# Patient Record
Sex: Female | Born: 1950 | Race: White | Hispanic: No | Marital: Married | Smoking: Former smoker
Health system: Southern US, Community
[De-identification: ages and names within clinical notes are randomized; demographics above are authoritative.]

## PROBLEM LIST (undated history)

## (undated) DIAGNOSIS — K219 Gastro-esophageal reflux disease without esophagitis: Secondary | ICD-10-CM

## (undated) DIAGNOSIS — K589 Irritable bowel syndrome without diarrhea: Secondary | ICD-10-CM

## (undated) DIAGNOSIS — Z87898 Personal history of other specified conditions: Secondary | ICD-10-CM

## (undated) DIAGNOSIS — I471 Supraventricular tachycardia, unspecified: Secondary | ICD-10-CM

## (undated) DIAGNOSIS — Q625 Duplication of ureter: Secondary | ICD-10-CM

## (undated) DIAGNOSIS — M8580 Other specified disorders of bone density and structure, unspecified site: Secondary | ICD-10-CM

## (undated) HISTORY — DX: Supraventricular tachycardia, unspecified: I47.10

## (undated) HISTORY — PX: COLPOSCOPY: SHX161

## (undated) HISTORY — DX: Duplication of ureter: Q62.5

## (undated) HISTORY — DX: Supraventricular tachycardia: I47.1

## (undated) HISTORY — DX: Personal history of other specified conditions: Z87.898

## (undated) HISTORY — DX: Gastro-esophageal reflux disease without esophagitis: K21.9

## (undated) HISTORY — DX: Other specified disorders of bone density and structure, unspecified site: M85.80

## (undated) HISTORY — DX: Irritable bowel syndrome without diarrhea: K58.9

## (undated) HISTORY — PX: CERVICAL BIOPSY  W/ LOOP ELECTRODE EXCISION: SUR135

---

## 1999-07-29 ENCOUNTER — Encounter: Admission: RE | Admit: 1999-07-29 | Discharge: 1999-07-29 | Payer: Self-pay | Admitting: Family Medicine

## 1999-07-29 ENCOUNTER — Encounter: Payer: Self-pay | Admitting: Family Medicine

## 2000-08-03 ENCOUNTER — Other Ambulatory Visit: Admission: RE | Admit: 2000-08-03 | Discharge: 2000-08-03 | Payer: Self-pay | Admitting: Family Medicine

## 2000-10-19 ENCOUNTER — Encounter: Admission: RE | Admit: 2000-10-19 | Discharge: 2000-10-19 | Payer: Self-pay | Admitting: Family Medicine

## 2000-10-19 ENCOUNTER — Encounter (INDEPENDENT_AMBULATORY_CARE_PROVIDER_SITE_OTHER): Payer: Self-pay | Admitting: *Deleted

## 2000-10-19 ENCOUNTER — Encounter: Payer: Self-pay | Admitting: Family Medicine

## 2001-06-27 ENCOUNTER — Emergency Department (HOSPITAL_COMMUNITY): Admission: EM | Admit: 2001-06-27 | Discharge: 2001-06-27 | Payer: Self-pay | Admitting: Emergency Medicine

## 2001-08-08 ENCOUNTER — Other Ambulatory Visit: Admission: RE | Admit: 2001-08-08 | Discharge: 2001-08-08 | Payer: Self-pay | Admitting: Obstetrics and Gynecology

## 2002-03-28 ENCOUNTER — Encounter (INDEPENDENT_AMBULATORY_CARE_PROVIDER_SITE_OTHER): Payer: Self-pay | Admitting: *Deleted

## 2002-03-28 ENCOUNTER — Encounter: Payer: Self-pay | Admitting: Family Medicine

## 2002-03-28 ENCOUNTER — Encounter: Admission: RE | Admit: 2002-03-28 | Discharge: 2002-03-28 | Payer: Self-pay | Admitting: Family Medicine

## 2002-08-14 ENCOUNTER — Other Ambulatory Visit: Admission: RE | Admit: 2002-08-14 | Discharge: 2002-08-14 | Payer: Self-pay | Admitting: Obstetrics and Gynecology

## 2003-08-29 ENCOUNTER — Other Ambulatory Visit: Admission: RE | Admit: 2003-08-29 | Discharge: 2003-08-29 | Payer: Self-pay | Admitting: Obstetrics and Gynecology

## 2004-09-21 ENCOUNTER — Other Ambulatory Visit: Admission: RE | Admit: 2004-09-21 | Discharge: 2004-09-21 | Payer: Self-pay | Admitting: Obstetrics and Gynecology

## 2004-11-30 ENCOUNTER — Ambulatory Visit: Payer: Self-pay | Admitting: Internal Medicine

## 2004-12-18 ENCOUNTER — Ambulatory Visit: Payer: Self-pay | Admitting: Internal Medicine

## 2005-09-24 ENCOUNTER — Other Ambulatory Visit: Admission: RE | Admit: 2005-09-24 | Discharge: 2005-09-24 | Payer: Self-pay | Admitting: Obstetrics and Gynecology

## 2005-09-24 ENCOUNTER — Other Ambulatory Visit: Admission: RE | Admit: 2005-09-24 | Discharge: 2005-09-24 | Payer: Self-pay | Admitting: Obstetrics & Gynecology

## 2006-09-29 ENCOUNTER — Other Ambulatory Visit: Admission: RE | Admit: 2006-09-29 | Discharge: 2006-09-29 | Payer: Self-pay | Admitting: Obstetrics and Gynecology

## 2007-10-10 ENCOUNTER — Other Ambulatory Visit: Admission: RE | Admit: 2007-10-10 | Discharge: 2007-10-10 | Payer: Self-pay | Admitting: Obstetrics and Gynecology

## 2007-10-30 ENCOUNTER — Other Ambulatory Visit: Admission: RE | Admit: 2007-10-30 | Discharge: 2007-10-30 | Payer: Self-pay | Admitting: Obstetrics and Gynecology

## 2008-01-31 ENCOUNTER — Other Ambulatory Visit: Admission: RE | Admit: 2008-01-31 | Discharge: 2008-01-31 | Payer: Self-pay | Admitting: Obstetrics and Gynecology

## 2008-03-28 ENCOUNTER — Encounter (INDEPENDENT_AMBULATORY_CARE_PROVIDER_SITE_OTHER): Payer: Self-pay | Admitting: *Deleted

## 2008-04-18 ENCOUNTER — Encounter (INDEPENDENT_AMBULATORY_CARE_PROVIDER_SITE_OTHER): Payer: Self-pay | Admitting: *Deleted

## 2008-04-19 ENCOUNTER — Encounter: Admission: RE | Admit: 2008-04-19 | Discharge: 2008-04-19 | Payer: Self-pay | Admitting: Family Medicine

## 2008-04-19 ENCOUNTER — Encounter (INDEPENDENT_AMBULATORY_CARE_PROVIDER_SITE_OTHER): Payer: Self-pay | Admitting: *Deleted

## 2008-04-29 ENCOUNTER — Encounter (INDEPENDENT_AMBULATORY_CARE_PROVIDER_SITE_OTHER): Payer: Self-pay | Admitting: *Deleted

## 2008-05-14 ENCOUNTER — Other Ambulatory Visit: Admission: RE | Admit: 2008-05-14 | Discharge: 2008-05-14 | Payer: Self-pay | Admitting: Obstetrics and Gynecology

## 2008-05-20 ENCOUNTER — Encounter (INDEPENDENT_AMBULATORY_CARE_PROVIDER_SITE_OTHER): Payer: Self-pay | Admitting: *Deleted

## 2008-06-17 ENCOUNTER — Encounter (INDEPENDENT_AMBULATORY_CARE_PROVIDER_SITE_OTHER): Payer: Self-pay | Admitting: *Deleted

## 2008-09-23 ENCOUNTER — Encounter (INDEPENDENT_AMBULATORY_CARE_PROVIDER_SITE_OTHER): Payer: Self-pay | Admitting: *Deleted

## 2009-05-13 ENCOUNTER — Ambulatory Visit (HOSPITAL_COMMUNITY): Admission: RE | Admit: 2009-05-13 | Discharge: 2009-05-13 | Payer: Self-pay | Admitting: Obstetrics and Gynecology

## 2009-05-13 HISTORY — PX: ABDOMINAL HYSTERECTOMY: SHX81

## 2009-11-10 ENCOUNTER — Telehealth: Payer: Self-pay | Admitting: Internal Medicine

## 2009-11-10 ENCOUNTER — Encounter (INDEPENDENT_AMBULATORY_CARE_PROVIDER_SITE_OTHER): Payer: Self-pay | Admitting: *Deleted

## 2009-12-17 DIAGNOSIS — M899 Disorder of bone, unspecified: Secondary | ICD-10-CM | POA: Insufficient documentation

## 2009-12-17 DIAGNOSIS — I498 Other specified cardiac arrhythmias: Secondary | ICD-10-CM | POA: Insufficient documentation

## 2009-12-17 DIAGNOSIS — J309 Allergic rhinitis, unspecified: Secondary | ICD-10-CM | POA: Insufficient documentation

## 2009-12-17 DIAGNOSIS — K648 Other hemorrhoids: Secondary | ICD-10-CM | POA: Insufficient documentation

## 2009-12-17 DIAGNOSIS — K589 Irritable bowel syndrome without diarrhea: Secondary | ICD-10-CM

## 2009-12-17 DIAGNOSIS — L439 Lichen planus, unspecified: Secondary | ICD-10-CM | POA: Insufficient documentation

## 2009-12-17 DIAGNOSIS — F411 Generalized anxiety disorder: Secondary | ICD-10-CM | POA: Insufficient documentation

## 2009-12-17 DIAGNOSIS — M949 Disorder of cartilage, unspecified: Secondary | ICD-10-CM

## 2009-12-17 DIAGNOSIS — K219 Gastro-esophageal reflux disease without esophagitis: Secondary | ICD-10-CM | POA: Insufficient documentation

## 2009-12-17 DIAGNOSIS — L80 Vitiligo: Secondary | ICD-10-CM | POA: Insufficient documentation

## 2009-12-22 ENCOUNTER — Ambulatory Visit: Payer: Self-pay | Admitting: Internal Medicine

## 2009-12-22 DIAGNOSIS — R1011 Right upper quadrant pain: Secondary | ICD-10-CM

## 2009-12-23 LAB — CONVERTED CEMR LAB
Bilirubin Urine: NEGATIVE
Hemoglobin, Urine: NEGATIVE
Ketones, ur: NEGATIVE mg/dL
Nitrite: NEGATIVE
Specific Gravity, Urine: 1.01 (ref 1.000–1.030)
Total Protein, Urine: NEGATIVE mg/dL
Urine Glucose: NEGATIVE mg/dL
Urobilinogen, UA: 0.2 (ref 0.0–1.0)
pH: 8 (ref 5.0–8.0)

## 2009-12-25 ENCOUNTER — Ambulatory Visit
Admission: RE | Admit: 2009-12-25 | Discharge: 2009-12-25 | Payer: Self-pay | Source: Home / Self Care | Admitting: Gynecologic Oncology

## 2010-01-21 ENCOUNTER — Telehealth: Payer: Self-pay | Admitting: Internal Medicine

## 2010-03-17 NOTE — Letter (Signed)
Summary: Dr Evette Cristal Office Note  Dr Evette Cristal Office Note   Imported By: Lamona Curl CMA (AAMA) 12/17/2009 17:24:42  _____________________________________________________________________  External Attachment:    Type:   Image     Comment:   External Document

## 2010-03-17 NOTE — Letter (Signed)
Summary: Dr Evette Cristal Office Note  Dr Evette Cristal Office Note   Imported By: Lamona Curl CMA (AAMA) 12/17/2009 17:25:13  _____________________________________________________________________  External Attachment:    Type:   Image     Comment:   External Document

## 2010-03-17 NOTE — Progress Notes (Signed)
Summary: triage  Phone Note From Other Clinic Call back at [phone] (pt's home)   Caller: Patient Caller: records faxed Call For: Dr. Juanda Chance Reason for Call: Schedule Patient Appt Summary of Call: pt referred by [facility] and ok'ed by Dr. Juanda Chance... ok to sch... nothing available... faxed records given to [facility] Initial call taken by: Vallarie Mare,  November 10, 2009 4:29 PM  Follow-up for Phone Call        NP3 12/22/09 8:45 Follow-up by: Darcey Nora RN, CGRN,  November 10, 2009 4:43 PM  Additional Follow-up for Phone Call Additional follow up Details #1::        pt notifiedof 12/22/2009 appt at 8:45... pt ok with this date and time... np ppwk will be mailed to verified address... records given to Hortense Ramal for upcoming appt Additional Follow-up by: Vallarie Mare,  November 10, 2009 4:49 PM

## 2010-03-17 NOTE — Letter (Signed)
Summary: Dr Evette Cristal Office Note  Dr Evette Cristal Office Note   Imported By: Lamona Curl CMA (AAMA) 12/17/2009 17:19:23  _____________________________________________________________________  External Attachment:    Type:   Image     Comment:   External Document

## 2010-03-17 NOTE — Progress Notes (Signed)
Summary: update  Phone Note Call from Patient Call back at Home Phone [phone]   Caller: Patient Call For: Dr. Juanda Chance Reason for Call: Talk to Nurse Summary of Call: finshed Chlordiazepoxide-Clidiniu and pt says it helped but has not "solved the problem" Initial call taken by: Vallarie Mare,  January 21, 2010 3:38 PM  Follow-up for Phone Call        Patient calling with her update after taking the Librax for one month. Patient states the Librax helped her left abdominal pain on some days and some days it did not help. Overall, she feels her pain was decreased. Patient states that in the past, she has taken Xanax prn for the pain and this helped much better than the Librax . Please, advise. Follow-up by: Jesse Fall RN,  January 21, 2010 3:56 PM  Additional Follow-up for Phone Call Additional follow up Details #1::        Ok, try Xanax .25 mg, # 90 1 by mouth three times a day as needed abd pain, 1 refill. Please tell her that Xanax could be habit forming . Additional Follow-up by: Hart Carwin MD,  January 21, 2010 11:06 PM    Additional Follow-up for Phone Call Additional follow up Details #2::    Patient notified of Dr. Regino Schultze recommendations. Patient is aware Xanax can be habit forming. Rx faxed to patients pharmacy. Follow-up by: Jesse Fall RN,  January 22, 2010 10:35 AM  New/Updated Medications: ALPRAZOLAM 0.25 MG TABS (ALPRAZOLAM) Take one tab three times a day as needed abdominal pain Prescriptions: ALPRAZOLAM 0.25 MG TABS (ALPRAZOLAM) Take one tab three times a day as needed abdominal pain  #90 x 1   Entered by:   Jesse Fall RN   Authorized by:   Hart Carwin MD   Signed by:   Jesse Fall RN on 01/22/2010   Method used:   Printed then faxed to ...       [facility]* (retail)       [address]       [place], [place]  [id number]       Ph: [phone]       Fax: [phone]   RxID:   [id number]

## 2010-03-17 NOTE — Letter (Signed)
Summary: Office Visit-Dr [employer] Visit-Dr Evette Cristal   Imported By: Lamona Curl CMA (AAMA) 12/17/2009 17:33:26  _____________________________________________________________________  External Attachment:    Type:   Image     Comment:   External Document

## 2010-03-17 NOTE — Letter (Signed)
Summary: New Patient letter  [facility]  [address] [place], [place] [address]   Phone: [phone]  Fax: [phone]       11/10/2009 MRN: [id number]  Marissa Douglas [address] [place], [place]  [address]  Dear Marissa Douglas,  Welcome to the Gastroenterology Division at [facility].    You are scheduled to see Dr. Juanda Chance on 12/22/2009 at 8:45PM on the 3rd floor at [facility], [address]  We ask that you try to arrive at our office 15 minutes prior to your appointment time to allow for check-in.  We would like you to complete the enclosed self-administered evaluation form prior to your visit and bring it with you on the day of your appointment.  We will review it with you.  Also, please bring a complete list of all your medications or, if you prefer, bring the medication bottles and we will list them.  Please bring your insurance card so that we may make a copy of it.  If your insurance requires a referral to see a specialist, please bring your referral form from your primary care physician.  Co-payments are due at the time of your visit and may be paid by cash, check or credit card.     Your office visit will consist of a consult with your physician (includes a physical exam), any laboratory testing he/she may order, scheduling of any necessary diagnostic testing (e.g. x-ray, ultrasound, CT-scan), and scheduling of a procedure (e.g. Endoscopy, Colonoscopy) if required.  Please allow enough time on your schedule to allow for any/all of these possibilities.    If you cannot keep your appointment, please call [phone] to cancel or reschedule prior to your appointment date.  This allows [place] the opportunity to schedule an appointment for another patient in need of care.  If you do not cancel or reschedule by 5 p.m. the business day prior to your appointment date, you will be charged a $50.00 late cancellation/no-show fee.    Thank you for choosing  [facility] for your medical needs.  We appreciate the opportunity to care for you.  Please visit [place] at our website  to learn more about our practice.                     Sincerely,                                                             The Gastroenterology Division

## 2010-03-17 NOTE — Letter (Signed)
Summary: [facility]  [facility] Office Note   Imported By: Lamona Curl CMA (AAMA) 12/17/2009 17:18:10  _____________________________________________________________________  External Attachment:    Type:   Image     Comment:   External Document

## 2010-03-17 NOTE — Letter (Signed)
Summary: [facility]  [facility] Office Note   Imported By: Lamona Curl CMA (AAMA) 12/17/2009 17:26:26  _____________________________________________________________________  External Attachment:    Type:   Image     Comment:   External Document

## 2010-03-17 NOTE — Assessment & Plan Note (Signed)
Summary: IBS/Marissa Douglas   History of Present Illness Visit Type: new patient  Primary GI MD: Lina Sar MD Primary Provider: Clayborn Heron, MD  Requesting Provider: na  Chief Complaint: Pt c/o IBS, GERD, and diarrhea History of Present Illness:   This is a 60 year old white female with lower abdominal pain accross  suprapubic area and  lower abdomen anteriorly without radiation to the rectum or to the back. The pain started gradually about 2 years ago after she got upset. Since then, it has not been associated with stress at all. There has been some improvement after  a bowel movement. The pain is relieved completely when laying down after about 10 minutes. It never wakes her up at night. It starts about an hour after she gets up in the morning. She has frequent urination every 2 hours. Patient underwent a robotic TAH and BSO in March of this year for abnormal cytology. Following surgery, the pain disappeared for about 3 weeks and then came back. She has at least half days without pain. It is not associated with eating and it has not been progressive. She has been able to go on with her daily activities. She works out 3 times a week and walks at least 2 miles a day. Her weight has been stable. She was tried on Levsin, Paxil and Xanax. Xanax 0.25 mg relieves the pain within an hour. Her last colonoscopy in 2006 was normal except for internal hemorrhoids. A CT scan of the abdomen in 2010 was essentially normal except for calcifications in the aorta. An upper abdominal ultrasound in 2002 was normal. An upper GI series in 2004 was also normal.   GI Review of Systems    Reports acid reflux and  heartburn.      Denies abdominal pain, belching, bloating, chest pain, dysphagia with liquids, dysphagia with solids, loss of appetite, nausea, vomiting, vomiting blood, weight loss, and  weight gain.      Reports diarrhea and  irritable bowel syndrome.     Denies anal fissure, black tarry stools, change in  bowel habit, constipation, diverticulosis, fecal incontinence, heme positive stool, hemorrhoids, jaundice, light color stool, liver problems, rectal bleeding, and  rectal pain.    Current Medications (verified): 1)  Alprazolam 0.25 Mg Tabs (Alprazolam) .... Take 1 Tablet By Mouth Three Times A Day As Needed 2)  Calcium 600-D 600-400 Mg-Unit Tabs (Calcium Carbonate-Vitamin D) .... Take 1 Tablet By Mouth Two Times A Day 3)  Omega-3 1000 Mg Caps (Omega-3 Fatty Acids) .... Take 1 Capsule By Mouth Two Times A Day 4)  Multivitamins  Tabs (Multiple Vitamin) .... Take 1 Tablet By Mouth Once A Day 5)  Benadryl 25 Mg Tabs (Diphenhydramine Hcl) .... Take 1 Capsule By Mouth 3-4 Times Daily As Needed For Seasonal Allergies 6)  Prilosec Otc 20 Mg Tbec (Omeprazole Magnesium) .... As Needed 7)  Metoprolol Tartrate 50 Mg Tabs (Metoprolol Tartrate) .... Take 1/2 Tablet At [facility] of Tachycardia and Repeat in 1-2 Hours 8)  Plaquenil 200 Mg Tabs (Hydroxychloroquine Sulfate) .... Takes 400mg  Once Daily By Mouth 9)  Folic Acid 800 Mcg Tabs (Folic Acid) .... One Tablet By Mouth Once Daily  Allergies (verified): 1)  ! Fosamax  Past History:  Past Medical History: INTERNAL HEMORRHOIDS (ICD-455.0) ANXIETY (ICD-300.00) IRRITABLE BOWEL SYNDROME (ICD-564.1) ALLERGIC RHINITIS (ICD-477.9) OSTEOPENIA (ICD-733.90) VITILIGO (ICD-709.01) LICHEN PLANUS (ICD-697.0) SUPRAVENTRICULAR TACHYCARDIA (ICD-427.89) GERD (ICD-530.81)    Past Surgical History: Reviewed history from 12/17/2009 and no changes required. Foot Surgery Total Hysterectomy  Family History: Reviewed history from 12/17/2009 and no changes required. Family History of Heart Disease: PGF,PGM,MGF No FH of Colon Cancer:  Social History: Homemaker Married No Childern Patient currently smokes.  Alcohol Use - yes Daily Caffeine Use: coffee and tea  Illicit Drug Use - no Drug Use:  no  Review of Systems       The patient complains of allergy/sinus,  anxiety-new, and skin rash.  The patient denies anemia, arthritis/joint pain, back pain, blood in urine, breast changes/lumps, change in vision, confusion, cough, coughing up blood, depression-new, fainting, fatigue, fever, headaches-new, hearing problems, heart murmur, heart rhythm changes, itching, menstrual pain, muscle pains/cramps, night sweats, nosebleeds, pregnancy symptoms, shortness of breath, sleeping problems, sore throat, swelling of feet/legs, swollen lymph glands, thirst - excessive , urination - excessive , urination changes/pain, urine leakage, vision changes, and voice change.         Pertinent positive and negative review of systems were noted in the above HPI. All other ROS was otherwise negative.   Vital Signs:  Patient profile:   60 year old female Height:      65 inches Weight:      132 pounds BMI:     22.05 BSA:     1.66 Pulse rate:   76 / minute Pulse rhythm:   regular BP sitting:   128 / 80  (left arm) Cuff size:   regular  Vitals Entered By: Ok Anis CMA (December 22, 2009 8:19 AM)  Physical Exam  General:  Well developed, well nourished, no acute distress. Mouth:  No deformity or lesions, dentition normal. Lungs:  Clear throughout to auscultation. Heart:  Regular rate and rhythm; no murmurs, rubs,  or bruits. Abdomen:  Soft, nontender and nondistended. No masses, hepatosplenomegaly or hernias noted. Normal bowel sounds with minimal tenderness in the right lower quadrant. When standing there is no hernia. There is mild discomfort when pressing over the urinary bladder. Rectal:  normal perianal area. Normal rectal tone. Stool is soft Hemoccult negative. No impaction.   Impression & Recommendations:  Problem # 1:  RUQ PAIN (ICD-789.01) Patient hasa  pain in the suprapubic area and across the lower abdomen. It resembles pressure due to  pelvic relaxation. The pain is relieved by lying down and is worse by standing up. There is no evidence of hernia on physical  exam or on CT scan. Pain is relieved by antispasmodics . Irritable bowel syndrome is another possibility. Symptomatic diverticulosis is unlikely since no diverticuli were reported on her last colonoscopy. There is no evidence of acute GI blood loss. We will try her on Librax one 2-3 times a day and if not successful, I would refer her to a urologist to rule out interstitial cystitis. I would also consider a repeat colonoscopy if Librax does not help. Orders: TLB-Udip w/ Micro (81001-[id number])  Patient Instructions: 1)  Please pick up your prescriptions at the pharmacy. Electronic prescription(s) has already been sent for Librax. 2)  Your physician requests that you go to the basement floor of our office to have the following labwork completed before leaving today: Urinalysis 3)  Copy sent to : Dr V.Rankins 4)  The medication list was reviewed and reconciled.  All changed / newly prescribed medications were explained.  A complete medication list was provided to the patient / caregiver. Prescriptions: LIBRAX 2.5-5 MG CAPS (CLIDINIUM-CHLORDIAZEPOXIDE) Take 1 capsule by mouth two times a day to three times a day as needed  #90 x 0   Entered by:  Dottie Nelson-Smith CMA (AAMA)   Authorized by:   Hart Carwin MD   Signed by:   Lamona Curl CMA (AAMA) on 12/22/2009   Method used:   Electronically to        [facility]* (retail)       [address]       [place], [place]  [id number]       Ph: [phone]       Fax: [phone]   RxID:   [id number]

## 2010-03-17 NOTE — Procedures (Signed)
Summary: COLON   Colonoscopy  Procedure date:  12/18/2004  Findings:      Location:  [facility].    Procedures Next Due Date:    Colonoscopy: 12/2014 Patient Name: Marissa, Douglas MRN:  Procedure Procedures: Colonoscopy CPT: 14782.  Personnel: Endoscopist: Dora L. Juanda Chance, MD.  Referred By: Mosetta Putt, M.D.  Exam Location: Exam performed in Outpatient Clinic. Outpatient  Patient Consent: Procedure, Alternatives, Risks and Benefits discussed, consent obtained, from patient. Consent was obtained by the RN.  Indications  Average Risk Screening Routine.  History  Current Medications: Patient is not currently taking Coumadin.  Pre-Exam Physical: Performed Dec 18, 2004. Entire physical exam was normal.  Exam Exam: Extent of exam reached: Cecum, extent intended: Cecum.  The cecum was identified by appendiceal orifice and IC valve. Colon retroflexion performed. Images taken. ASA Classification: I. Tolerance: good.  Monitoring: Pulse and BP monitoring, Oximetry used. Supplemental O2 given.  Colon Prep Used Miralax for colon prep. Prep results: good.  Sedation Meds: Patient assessed and found to be appropriate for moderate (conscious) sedation. Fentanyl 100 mcg. given IV. Versed 10 mg. given IV.  Findings - NORMAL EXAM: Cecum.  - HEMORRHOIDS: Internal. Size: Grade I. ICD9: Hemorrhoids, Internal: 455.0.   Assessment Normal examination.  Diagnoses: 455.0: Hemorrhoids, Internal.   Comments: no polyps Events  Unplanned Interventions: No intervention was required.  Unplanned Events: There were no complications. Plans Patient Education: Patient given standard instructions for: Yearly hemoccult testing recommended. Patient instructed to get routine colonoscopy every 10 years.  Disposition: After procedure patient sent to recovery. After recovery patient sent home.   This report was created from the original endoscopy report, which was  reviewed and signed by the above listed endoscopist.

## 2010-05-10 LAB — CBC
HCT: 41.5 % (ref 36.0–46.0)
Platelets: 296 10*3/uL (ref 150–400)
RDW: 12.4 % (ref 11.5–15.5)
WBC: 4.5 10*3/uL (ref 4.0–10.5)

## 2010-05-10 LAB — COMPREHENSIVE METABOLIC PANEL
ALT: 14 U/L (ref 0–35)
AST: 24 U/L (ref 0–37)
Albumin: 4.9 g/dL (ref 3.5–5.2)
Alkaline Phosphatase: 63 U/L (ref 39–117)
BUN: 8 mg/dL (ref 6–23)
Chloride: 97 mEq/L (ref 96–112)
GFR calc Af Amer: >60 mL/min (ref >60)
Potassium: 3.8 mEq/L (ref 3.5–5.1)
Sodium: 134 mEq/L — ABNORMAL LOW (ref 135–145)
Total Bilirubin: 1.2 mg/dL (ref 0.3–1.2)
Total Protein: 7.1 g/dL (ref 6.0–8.3)

## 2011-04-05 IMAGING — CR DG CHEST 2V
2 series · 2 of 2 positions shown · non-contrast
Comparison: None

CLINICAL DATA: Preop radiograph

CHEST - 2 VIEW

[w chest pa]
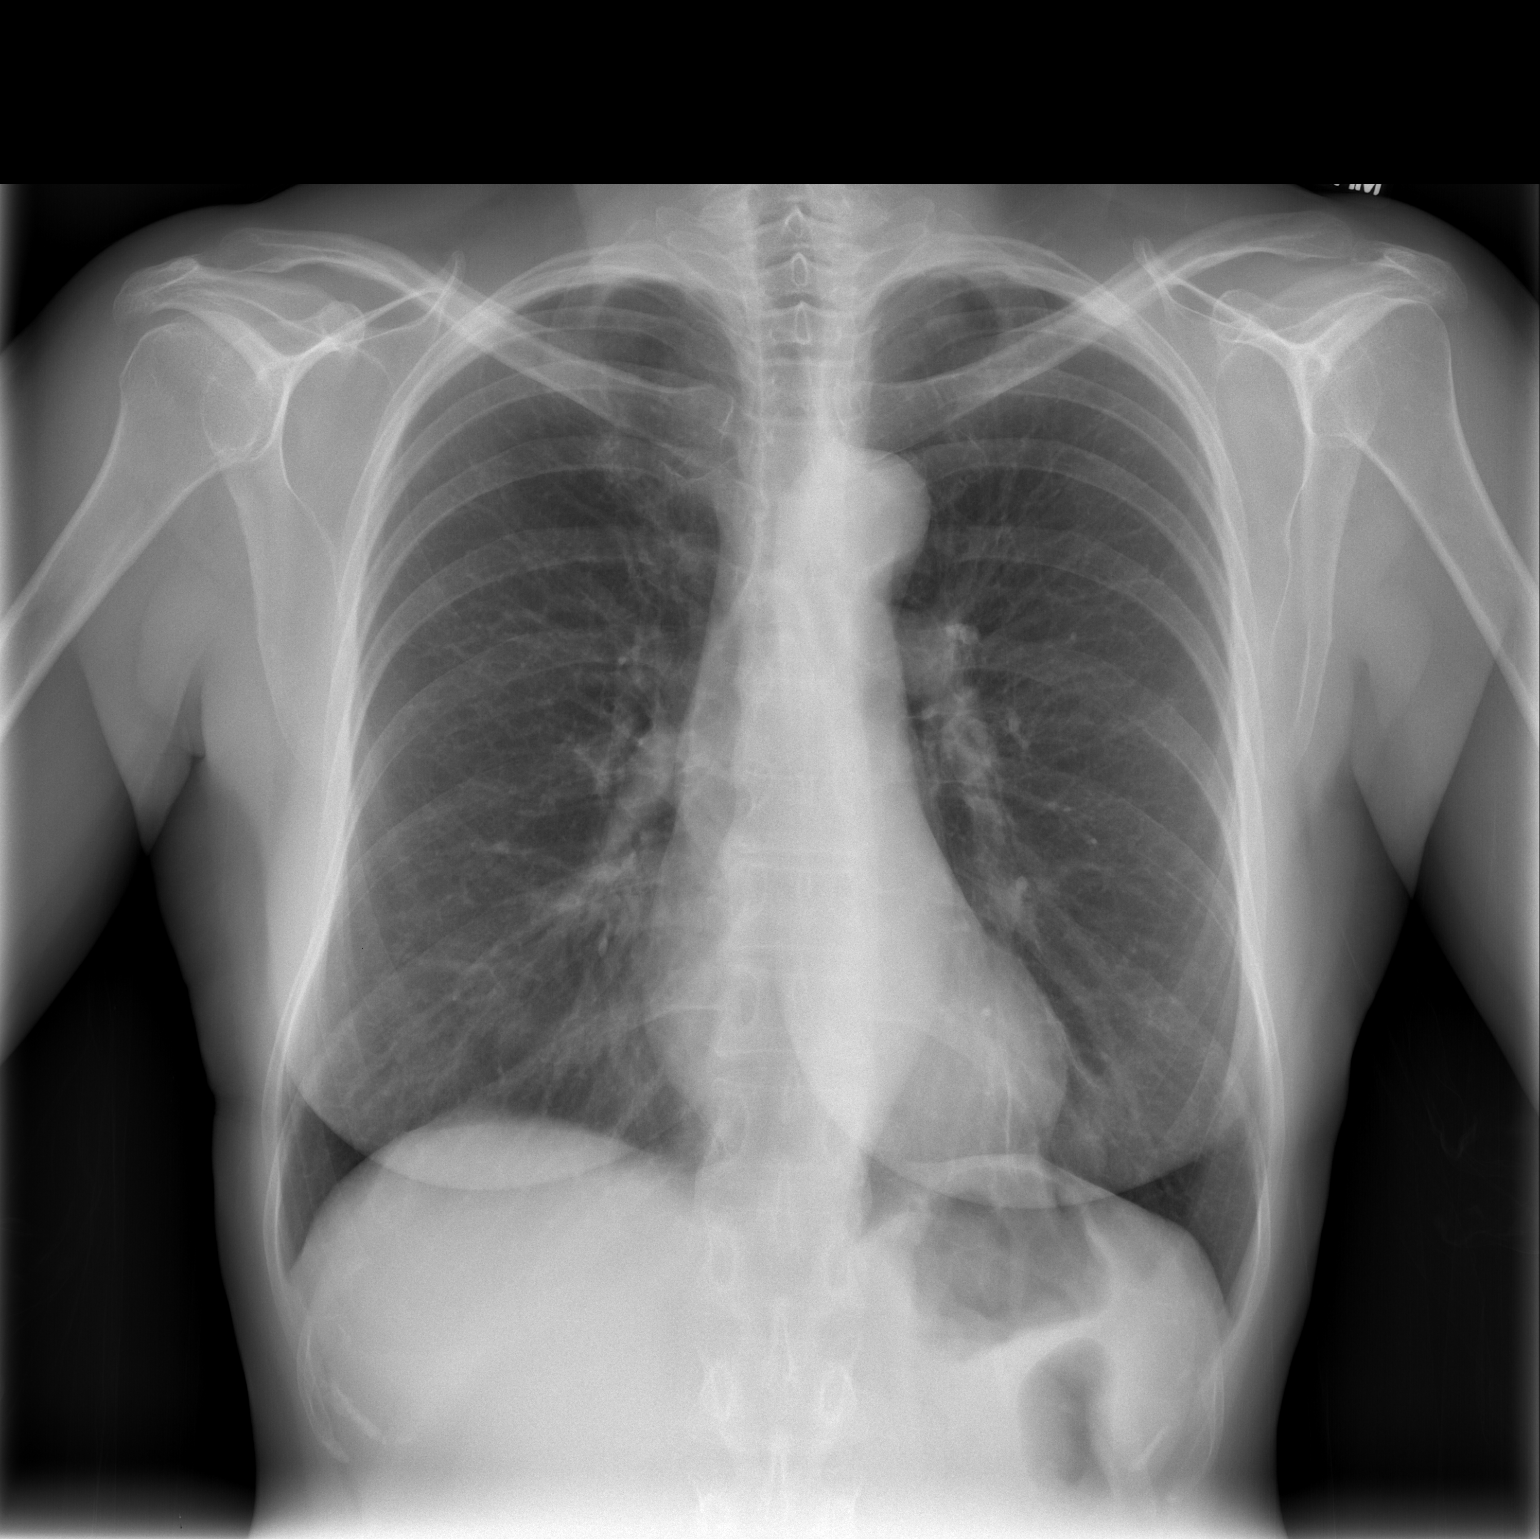

[w chest lat]
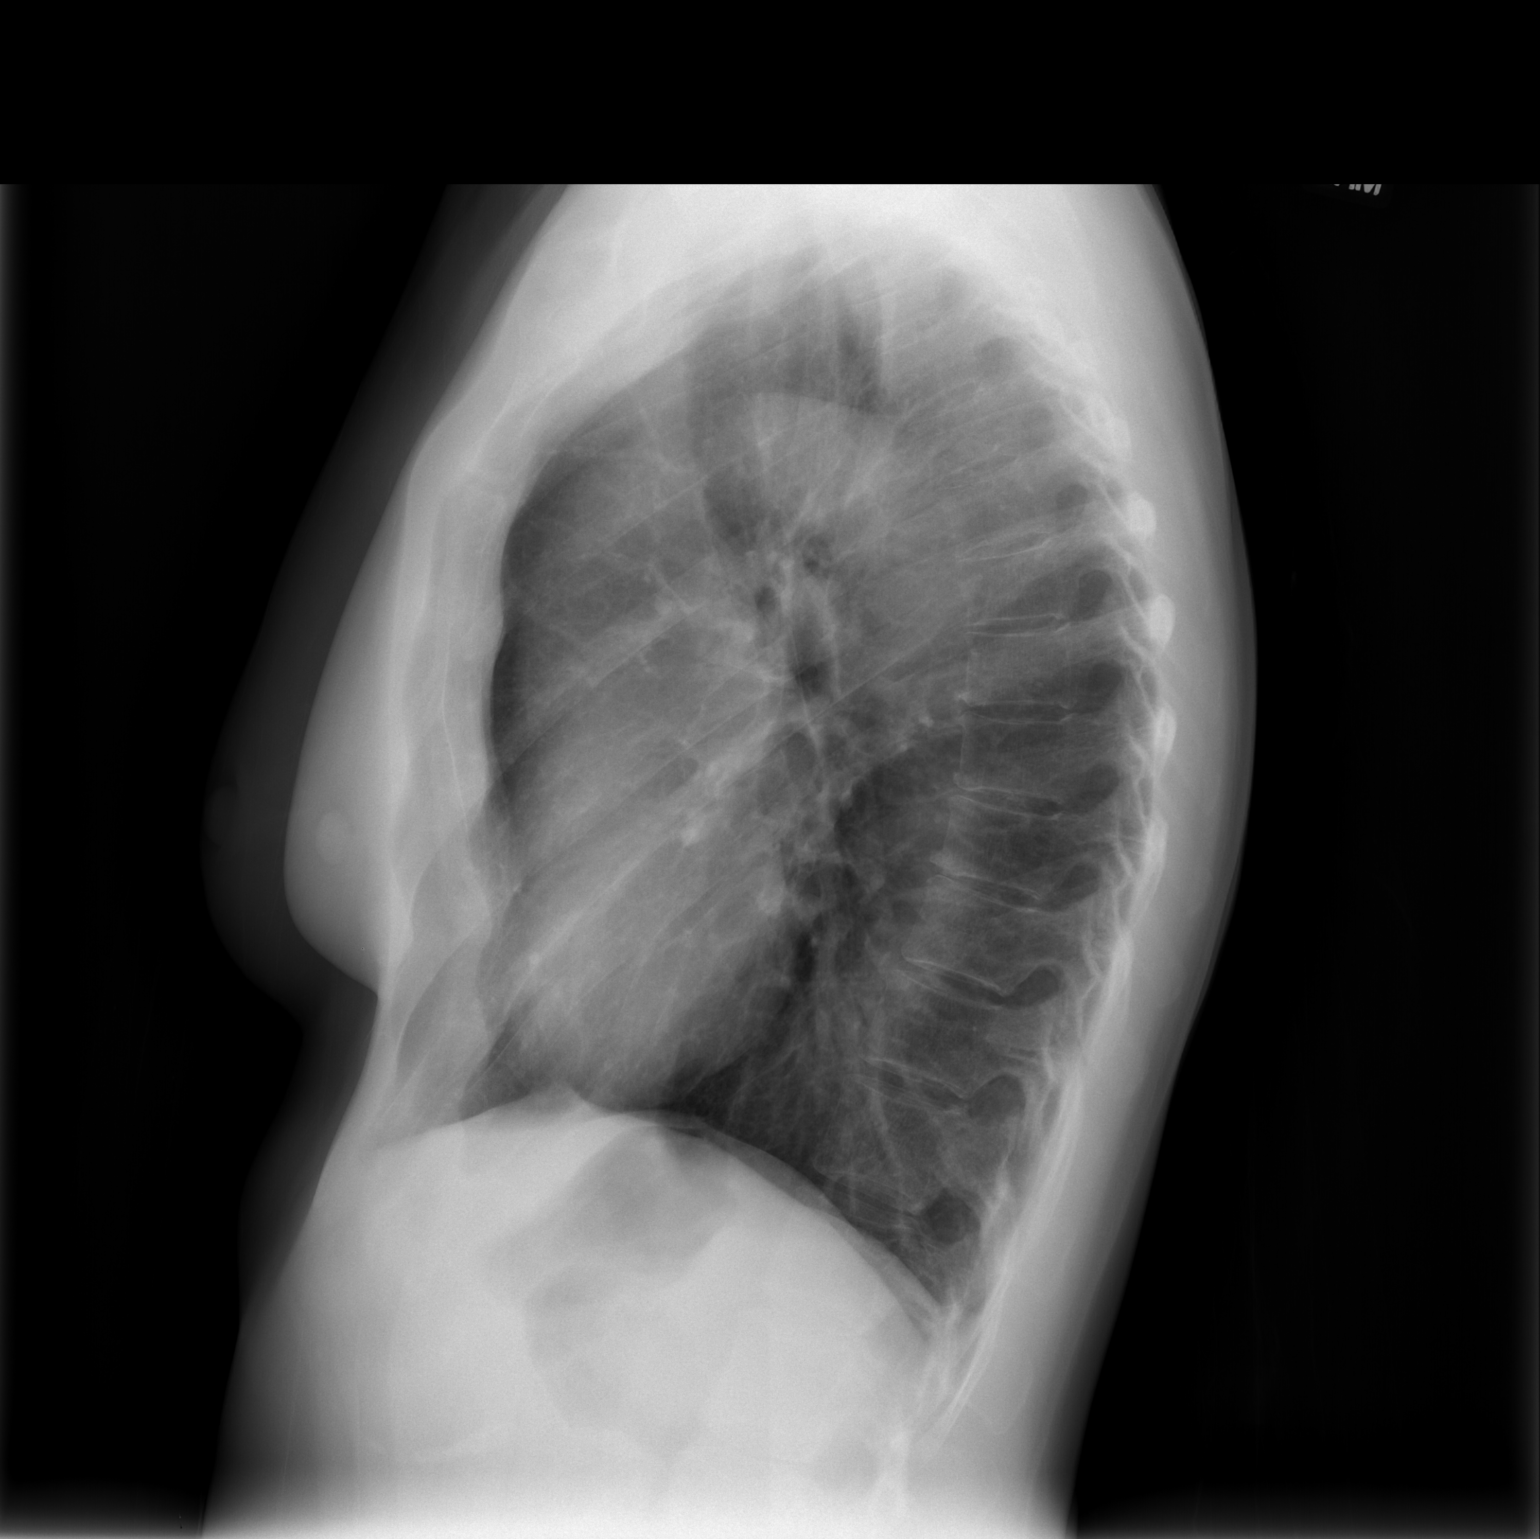

[2 of 2 positions shown; findings below may reference images not displayed]

FINDINGS: Heart size is normal.

No pleural effusion or pulmonary edema.

There is no airspace consolidation identified.

No pulmonary nodules or masses.

A scar like density is noted in the left lung base.
IMPRESSION: 1.  No active cardiopulmonary disease.

## 2013-01-03 ENCOUNTER — Telehealth: Payer: Self-pay | Admitting: Obstetrics and Gynecology

## 2013-01-03 NOTE — Telephone Encounter (Signed)
Dr. Edward Jolly, please advise. Do you want patient to use Estrace prior to annual exam?

## 2013-01-03 NOTE — Telephone Encounter (Signed)
On chart review, Dr. Tresa Res has indicated that the patient is to use Estrace vaginal cream 1/2 gram, twice weekly, for one month,  before her annual examination and pap smear.

## 2013-01-03 NOTE — Telephone Encounter (Signed)
Patient calling wanting advice about whether she should use RX for Estrace for one month prior to AEX as she has before with prior pap smears? Patient is a prior patient of Dr. Harlene Salts but is aware she is seeing Dr. Edward Jolly going forward. Patient has Estrace to use so she won't need a refill.

## 2013-01-04 NOTE — Telephone Encounter (Signed)
Spoke with patient and message from Dr. Edward Jolly given. Patient will use per instructions before AEX.

## 2013-01-26 ENCOUNTER — Encounter: Payer: Self-pay | Admitting: Obstetrics and Gynecology

## 2013-01-26 ENCOUNTER — Ambulatory Visit (INDEPENDENT_AMBULATORY_CARE_PROVIDER_SITE_OTHER): Payer: Managed Care, Other (non HMO) | Admitting: Obstetrics and Gynecology

## 2013-01-26 ENCOUNTER — Ambulatory Visit: Payer: Self-pay | Admitting: Obstetrics and Gynecology

## 2013-01-26 VITALS — BP 124/70 | HR 84 | Resp 16 | Ht 64.5 in | Wt 139.0 lb

## 2013-01-26 DIAGNOSIS — Z01419 Encounter for gynecological examination (general) (routine) without abnormal findings: Secondary | ICD-10-CM

## 2013-01-26 NOTE — Patient Instructions (Signed)

## 2013-01-26 NOTE — Progress Notes (Signed)
Patient ID: Marissa Douglas, female   DOB: 06-Sep-1950, 62 y.o.   MRN: [id number] GYNECOLOGY VISIT  PCP:  Barton Fanny, MD  Referring provider:   HPI: 62 y.o.   Married  Caucasian  female   No obstetric history on file. with Patient's last menstrual period was 02/15/1990.   here for   AEX. 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  All other follow up paps are normal.  Patient has been using vaginal estrogen cream twice a week for the last month.  Usually does not use it regularly. Patient will consider using regularly.  Hgb:    PCP Urine:  Neg  GYNECOLOGIC HISTORY: Patient's last menstrual period was 02/15/1990. Sexually active:  yes Partner preference: female Contraception:   hysterectomy Menopausal hormone therapy: none DES exposure:   no Blood transfusions:   no Sexually transmitted diseases:   HPV GYN Procedures:  T-TLH/BSO, Colposcopy, LEEP x 2 (2009 and 2010) Mammogram:  12/2012 JYN:WGNFA               Pap:01-21-12 wnl History of abnormal pap smear:  2009 and 2010 abnormal paps with LEEP procedures.   OB History   Grav Para Term Preterm Abortions TAB SAB Ect Mult Living                   LIFESTYLE: Exercise:    Gym and walking           Tobacco:    no Alcohol:       10 glasses of wine per week Drug use:     no  OTHER HEALTH MAINTENANCE: Tetanus/TDap:   2011 Gardisil:              n/a Influenza:     October 2014. Zostavax:      2013 Pneumonia:  2013  Bone density:      08-31-11--osteopenia:[facility] Colonoscopy:      12/2004 OZH:YQMV colonoscopy 2016 with Dr. Lina Sar  Cholesterol check:   PCP, Elevated HDL.   No family history on file.  Patient Active Problem List   Diagnosis Date Noted  . RUQ PAIN 12/22/2009  . ANXIETY 12/17/2009  . SUPRAVENTRICULAR TACHYCARDIA 12/17/2009  . INTERNAL HEMORRHOIDS 12/17/2009  .  ALLERGIC RHINITIS 12/17/2009  . GERD 12/17/2009  . IRRITABLE BOWEL SYNDROME 12/17/2009  . LICHEN PLANUS 12/17/2009  . VITILIGO 12/17/2009  . OSTEOPENIA 12/17/2009   Past Medical History  Diagnosis Date  . History of abnormal Pap smear   . Osteopenia   . Duplication of bilateral ureters     Past Surgical History  Procedure Laterality Date  . Colposcopy    . Cervical biopsy  w/ loop electrode excision  10/2007, 12/2008    x2  . Abdominal hysterectomy  05-13-09    R-TLH/BSO--recurrent dysplasia    ALLERGIES: Alendronate sodium  Current Outpatient Prescriptions  Medication Sig Dispense Refill  . Ascorbic Acid (VITAMIN C) 100 MG tablet Take 100 mg by mouth daily.      . folic acid (FOLVITE) 1 MG tablet Take 1 mg by mouth daily.      . magnesium 30 MG tablet Take 30 mg by mouth 2 (two) times daily.      . metoprolol (LOPRESSOR) 50 MG tablet Take 50 mg by mouth as needed.      . Multiple Vitamin (MULTIVITAMIN) capsule Take 1 capsule by mouth daily.      Marland Kitchen  omega-3 acid ethyl esters (LOVAZA) 1 G capsule Take 1 capsule by mouth 2 (two) times daily.      Marland Kitchen omeprazole (PRILOSEC) 20 MG capsule Take 20 mg by mouth daily.       No current facility-administered medications for this visit.     ROS:  Pertinent items are noted in HPI.  SOCIAL HISTORY:  Retired.  Husband will retire next week.  PHYSICAL EXAMINATION:    BP 124/70  Pulse 84  Resp 16  Ht 5' 4.5" (1.638 m)  Wt 139 lb (63.05 kg)  BMI 23.50 kg/m2  LMP 02/15/1990   Wt Readings from Last 3 Encounters:  01/26/13 139 lb (63.05 kg)  12/22/09 132 lb (59.875 kg)     Ht Readings from Last 3 Encounters:  01/26/13 5' 4.5" (1.638 m)  12/22/09 5\' 5"  (1.651 m)    General appearance: alert, cooperative and appears stated age Head: Normocephalic, without obvious abnormality, atraumatic Neck: no adenopathy, supple, symmetrical, trachea midline and thyroid not enlarged, symmetric, no tenderness/mass/nodules Lungs: clear to  auscultation bilaterally Breasts: Inspection negative, No nipple retraction or dimpling, No nipple discharge or bleeding, No axillary or supraclavicular adenopathy, Normal to palpation without dominant masses Heart: regular rate and rhythm Abdomen: soft, non-tender; no masses,  no organomegaly Extremities: extremities normal, atraumatic, no cyanosis or edema Skin: Skin color, texture, turgor normal. No rashes or lesions Lymph nodes: Cervical, supraclavicular, and axillary nodes normal. No abnormal inguinal nodes palpated Neurologic: Grossly normal  Pelvic: External genitalia:  no lesions              Urethra:  normal appearing urethra with no masses, tenderness or lesions              Bartholins and Skenes: normal                 Vagina: normal appearing vagina with normal color and discharge, no lesions              Cervix:  absent              Pap and high risk HPV testing done: yes.            Bimanual Exam:  Uterus:   absent                                      Adnexa: no masses                                      Rectovaginal: Confirms                                      Anus:  normal sphincter tone, no lesions  ASSESSMENT  Status post robotic hysterectomy with BSO for recurrent dysplasia. Osteopenia.  Intolerant of Alendronate.  PLAN  Mammogram yearly. Pap smear and high risk HPV testing performed.  Counseled on osteoporosis prevention, adequate intake of calcium and vitamin D. Patient will consider if would like to continue with vaginal estrogen therapy.  She will contact the office if she needs a refill. I discussed benefits and risks including thromboembolic events and breast cancer.  Medications per Epic orders Return annually or prn   An After Visit Summary was printed and  given to the patient.

## 2013-09-15 HISTORY — PX: CATARACT EXTRACTION: SUR2

## 2014-01-28 ENCOUNTER — Ambulatory Visit: Payer: Managed Care, Other (non HMO) | Admitting: Obstetrics and Gynecology

## 2014-01-30 ENCOUNTER — Telehealth: Payer: Self-pay

## 2014-01-30 NOTE — Telephone Encounter (Signed)
Spoke with patient regarding Bone density results.  Advised slight loss of bone from spine and right hip.  Still osteopenia overall.  No osteoporosis.  Continue with calcium and vitamin D and weight bearing exercises.  Will repeat scan in 2 years.

## 2014-02-01 ENCOUNTER — Ambulatory Visit (INDEPENDENT_AMBULATORY_CARE_PROVIDER_SITE_OTHER): Payer: Managed Care, Other (non HMO) | Admitting: Obstetrics and Gynecology

## 2014-02-01 ENCOUNTER — Encounter: Payer: Self-pay | Admitting: Obstetrics and Gynecology

## 2014-02-01 VITALS — BP 120/70 | HR 80 | Resp 14 | Ht 64.75 in | Wt 143.0 lb

## 2014-02-01 DIAGNOSIS — Z0000 Encounter for general adult medical examination without abnormal findings: Secondary | ICD-10-CM

## 2014-02-01 DIAGNOSIS — Z01419 Encounter for gynecological examination (general) (routine) without abnormal findings: Secondary | ICD-10-CM

## 2014-02-01 LAB — POCT URINALYSIS DIPSTICK
BILIRUBIN UA: NEGATIVE
Glucose, UA: NEGATIVE
Ketones, UA: NEGATIVE
Leukocytes, UA: NEGATIVE
Nitrite, UA: NEGATIVE
PROTEIN UA: NEGATIVE
RBC UA: NEGATIVE
Urobilinogen, UA: NEGATIVE
pH, UA: 5

## 2014-02-01 NOTE — Patient Instructions (Signed)

## 2014-02-01 NOTE — Progress Notes (Signed)
Patient ID: Marissa Douglas, female   DOB: [birth year], 63 y.o.   MRN: [id number] 63 y.o. No obstetric history on file. MarriedCaucasianF here for annual exam.    Did cataract surgery this year.  Each one week apart.  Osteopenia.   Does not tolerate Alendronate.  Has reflux.  Plans to join a new gym.   Dog died.  Not walking as much.   PCP:  Marissa FiedlerVictoria Rankins, MD  Patient's last menstrual period was 02/15/1990.          Sexually active: Yes.   female The current method of family planning is status post hysterectomy.    Exercising: Yes.    gym and walking. Smoker:  Yes, 3 cigarettes a day.  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 MMG:  12-13-13 fibroglandular density, benign intramammary node in left breast/wnl:Marissa Douglas Colonoscopy:  12/2004 wnl with Dr. Lina Sarora Douglas.  Next due 12/2014. BMD:   01-16-14 osteopenia:Marissa Douglas.  Left hip -2.1.  Right hp -2.3.  Spine -2.1. TDaP:  2011 Screening Labs:  Hb today: PCP, Urine today: Neg   reports that she has been smoking.  She does not have any smokeless tobacco history on file. She reports that she drinks about 1.2 oz of alcohol per week. She reports that she does not use illicit drugs.  Past Medical History  Diagnosis Date  . History of abnormal Pap smear   . Osteopenia   . Duplication of bilateral ureters     Past Surgical History  Procedure Laterality Date  . Colposcopy    . Cervical biopsy  w/ loop electrode excision  10/2007, 12/2008    x2  . Abdominal hysterectomy  05-13-09    R-TLH/BSO--recurrent dysplasia  . Cataract extraction Bilateral 09/2013    Current Outpatient Prescriptions  Medication Sig Dispense Refill  . Ascorbic Acid (VITAMIN C) 100 MG tablet Take 100 mg by mouth daily.    . Calcium Carb-Cholecalciferol (CALCIUM+D3 PO) Take 1 tablet by mouth  2 (two) times daily.    . folic acid (FOLVITE) 1 MG tablet Take 1 mg by mouth daily.    . magnesium 30 MG tablet Take 30 mg by mouth 2 (two) times daily.    . metoprolol (LOPRESSOR) 50 MG tablet Take 50 mg by mouth as needed.    . Multiple Vitamin (MULTIVITAMIN) capsule Take 1 capsule by mouth daily.    Marland Kitchen. omega-3 acid ethyl esters (LOVAZA) 1 G capsule Take 1 capsule by mouth 2 (two) times daily.    Marland Kitchen. omeprazole (PRILOSEC) 20 MG capsule Take 20 mg by mouth daily.     No current facility-administered medications for this visit.    History reviewed. No pertinent family history.  ROS:  Pertinent items are noted in HPI.  Otherwise, a comprehensive ROS was negative.  Exam:   BP 120/70 mmHg  Pulse 80  Resp 14  Ht 5' 4.75" (1.645 m)  Wt 143 lb (64.864 kg)  BMI 23.97 kg/m2  LMP 02/15/1990     Height: 5' 4.75" (164.5 cm)  Ht Readings from Last 3 Encounters:  02/01/14 5' 4.75" (1.645 m)  01/26/13 5' 4.5" (1.638 m)  12/22/09 5\' 5"  (1.651 m)    General appearance: alert, cooperative and appears stated age Head: Normocephalic, without obvious abnormality, atraumatic Neck: no adenopathy, supple, symmetrical, trachea midline and  thyroid normal to inspection and palpation Lungs: clear to auscultation bilaterally Breasts: normal appearance, no masses or tenderness, Inspection negative, No nipple retraction or dimpling, No nipple discharge or bleeding, No axillary or supraclavicular adenopathy, Normal to palpation without dominant masses Heart: regular rate and rhythm Abdomen: soft, non-tender; bowel sounds normal; no masses,  no organomegaly Extremities: extremities normal, atraumatic, no cyanosis or edema Skin: Skin color, texture, turgor normal. No rashes or lesions Lymph nodes: Cervical, supraclavicular, and axillary nodes normal. No abnormal inguinal nodes palpated Neurologic: Grossly normal  Pelvic: External genitalia:  no lesions              Urethra:  normal appearing urethra with no  masses, tenderness or lesions              Bartholins and Skenes: normal                 Vagina: normal appearing vagina with normal color and discharge, no lesions              Cervix: absent              Pap taken: Yes.   Bimanual Exam:  Uterus:  uterus absent              Adnexa:  No masses.               Rectovaginal: Confirms               Anus:  normal sphincter tone, no lesions  Chaperone was present for exam.  A:  Well Woman with normal exam Status post robotic TLH/BSO.  History of recurrent dysplasia.  Osteopenia. Intolerance to Alendronate. Reflux.  Smoker.   P:   Mammogram yearly. pap smear and HR HPV testing.  counseled on osteoporosis prevention and reduction of fracture risk.  Discussed Evista. No Rx at this time.  Bone density screening in 2 years.  Discussed smoking cessation.  return annually or prn

## 2014-02-06 LAB — IPS PAP TEST WITH HPV

## 2014-02-18 ENCOUNTER — Telehealth: Payer: Self-pay | Admitting: Emergency Medicine

## 2014-02-18 DIAGNOSIS — R87622 Low grade squamous intraepithelial lesion on cytologic smear of vagina (LGSIL): Secondary | ICD-10-CM

## 2014-02-18 NOTE — Telephone Encounter (Signed)
-----   Message from [place] E Amundson de Gwenevere Ghazi, MD sent at 02/10/2014  9:44 AM EST ----- Please contact patient regarding her colposcopy showing LGSIL of the vagina on her pap.  She has already had a hysterectomy for recurrent dysplasia of the cervix.   She will need a colposcopy with me.   Cc- Claudette Laws

## 2014-02-18 NOTE — Telephone Encounter (Signed)
Ok for use of the Estrace 1/2 gram pv at hs three times a week.  Do not use the night before the colposcopy.   Thank you.

## 2014-02-18 NOTE — Telephone Encounter (Signed)
Patient notified of message from Dr. Edward Jolly.  She is agreeable to scheduling colposcopy.  Brief description of procedure given to patient.  Colposcopy pre-procedure instructions given.   Make sure to eat a meal and hydrate before appointment.  Advised 800 mg of Motrin with food one hour prior to appointment. Motrin/Advil or Ibuprofen. Take 800 mg (Can purchase over the counter, you will need four 200 mg pills).  Advised will need to cancel or reschedule within 72 hours or will have $150.00 no show fee placed to account.   Patient verbalized understanding of preprocedure instructions and cancellation policy and will call to reschedule if will be on menses or has any concerns regarding pregnancy. Patient will be going out of town for the next 3 weeks. Scheduled procedure visit for when she is returning. Scheduled for 03/20/14 at 1000.  Order placed for  insurance pre certification. Advised patient she would be contacted with out of pockets costs for procedure.   Dr. Edward Jolly,  Patient states she has used estrace prior to other pap smears that was prescribed by Dr. Tresa Res. This is the first year patient reports not using estrace prior to pap smear. Patient would like to know if she should use Estrace prior to colposcopy.  Prior directions are 1/2 gram vaginally 3 times per week one month prior to pap smear.   Advised would send message to Dr. Edward Jolly and return call with response.  Patient agreeable.     cc Cathrine Muster.

## 2014-02-19 ENCOUNTER — Telehealth: Payer: Self-pay | Admitting: Obstetrics and Gynecology

## 2014-02-19 MED ORDER — ESTRADIOL 0.1 MG/GM VA CREA: TOPICAL_CREAM | VAGINAL | Status: DC

## 2014-02-19 NOTE — Telephone Encounter (Signed)
Spoke with patient. Advised that per benefit quote received, she will be responsible to pay $250.58 when she comes in for colpo. Patient agreeable.

## 2014-02-19 NOTE — Telephone Encounter (Signed)
Left message for patient to call back  

## 2014-02-19 NOTE — Telephone Encounter (Signed)
Proceed with colposcopy on 03/20/13.  No need to change travel plans. OK for vaginal estrogen Rx.

## 2014-02-19 NOTE — Telephone Encounter (Signed)
Spoke with patient, discussed message from Dr. Edward JollySilva.  Patient agreeable to instructions. Reports she will need new rx as the rx she has at home has expired.  New order placed for Estrace cream 0.1 mg/gram 1/2 gram pv at hs three times per week sent to [facility].  Patient would like to know if Dr. Edward JollySilva is okay with her having colposcopy 03/20/14 as scheduled. Patient states she is going to be out of town but states if Dr. Edward JollySilva thinks this procedure should be completed prior, she will do so.  Advised patient having procedure in less than one month is acceptable. Advised I did not think she needed to rearrange any travel plans. However, I would have Dr Edward JollySilva review and Dr. Edward JollySilva disagrees, I would call her back. Patient is agreeabl to this plan.

## 2014-02-19 NOTE — Telephone Encounter (Signed)
PR: $250.58

## 2014-03-20 ENCOUNTER — Encounter: Payer: Self-pay | Admitting: Obstetrics and Gynecology

## 2014-03-20 ENCOUNTER — Ambulatory Visit (INDEPENDENT_AMBULATORY_CARE_PROVIDER_SITE_OTHER): Payer: Managed Care, Other (non HMO) | Admitting: Obstetrics and Gynecology

## 2014-03-20 DIAGNOSIS — R87622 Low grade squamous intraepithelial lesion on cytologic smear of vagina (LGSIL): Secondary | ICD-10-CM

## 2014-03-20 MED ORDER — ESTRADIOL 0.1 MG/GM VA CREA: TOPICAL_CREAM | VAGINAL | Status: AC

## 2014-03-20 NOTE — Progress Notes (Signed)
Subjective:     Patient ID: Marissa Douglas, female   DOB: Oct 05, 1950, 64 y.o.   MRN: [id number]  HPI  Patient is here today for colposcopy for pap showing LGSIL and positive HR HPV on 02/04/14.  Patient upset about abnormal pap and HPV.  "I thought this was gone." "What did the hysterectomy do?"  History is as follows: S/P LEEP 2009 - CIN II. S/P LEEP 2010 - CIN I. Status post robotic TLH/BSO 2011 for recurrent dysplasia - atypia.  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  Had GYN ONC consultation - colpo done.  No lesions seen. E2 cream used.   Smokes one cigarette per day.  Wants to try ecigs.   Using Estrace cream but is going to run out.   Review of Systems     Objective:   Physical Exam  Genitourinary:        Colposcopy.  Consent for procedure.  Speculum placed in vagina.  Vaginal estrogen cream wiped out.  3% acetic acid placed.  No lesions seen of the vagina.  No biopsy taken.  Acetic acid soaked gauze to the vulva.  No lesions seen.  No biopsy taken.     Assessment:     History of recurrent cervical and vaginal dysplasia.  Positive HR HPV.  No lesions seen.     Plan:     Discussion regarding prior history and treatment.  Discussion regarding HPV.  Follow up in 6 months for a repap.  Rx for Estrace vaginal cream. Discussed smoking cessation.     15 minutes face to face time of which over 50% was spent in counseling.   After visit summary to patient.

## 2014-04-22 ENCOUNTER — Telehealth: Payer: Self-pay | Admitting: *Deleted

## 2014-04-22 ENCOUNTER — Encounter: Payer: Self-pay | Admitting: Internal Medicine

## 2014-04-22 NOTE — Telephone Encounter (Signed)
Detert returning [name]'s call

## 2014-04-22 NOTE — Telephone Encounter (Signed)
Patient calling to talk with "the person who handles insurance"

## 2014-05-03 ENCOUNTER — Encounter: Payer: Self-pay | Admitting: Physician Assistant

## 2014-05-03 ENCOUNTER — Ambulatory Visit (INDEPENDENT_AMBULATORY_CARE_PROVIDER_SITE_OTHER): Payer: Managed Care, Other (non HMO) | Admitting: Physician Assistant

## 2014-05-03 VITALS — BP 142/90 | HR 88 | Ht 64.5 in | Wt 143.5 lb

## 2014-05-03 DIAGNOSIS — K644 Residual hemorrhoidal skin tags: Secondary | ICD-10-CM

## 2014-05-03 DIAGNOSIS — K648 Other hemorrhoids: Secondary | ICD-10-CM

## 2014-05-03 DIAGNOSIS — K589 Irritable bowel syndrome without diarrhea: Secondary | ICD-10-CM

## 2014-05-03 DIAGNOSIS — K625 Hemorrhage of anus and rectum: Secondary | ICD-10-CM

## 2014-05-03 MED ORDER — HYOSCYAMINE SULFATE 0.125 MG SL SUBL: 0.1250 mg | SUBLINGUAL_TABLET | Freq: Three times a day (TID) | SUBLINGUAL | Status: DC | PRN

## 2014-05-03 MED ORDER — SUPREP BOWEL PREP KIT 17.5-3.13-1.6 GM/177ML PO SOLN: 1.0000 | ORAL | Status: DC

## 2014-05-03 NOTE — Progress Notes (Signed)
Patient ID: Marissa Douglas, female   DOB: 01-09-1951, 64 y.o.   MRN: [id number]    HPI:  Marissa Douglas is a 64 y.o.   female referred by Marissa Nip, MD due to rectal bleeding.  Marissa Douglas is a delightful lady with a history of supraventricular tachycardia, internal hemorrhoids, GERD, IBS, lichen planus, vitiligo, osteopenia, and anxiety. She had a colonoscopy in November 2006 by Dr. Olevia Perches which was a normal exam except for grade 1 internal hemorrhoids. She was advised to have surveillance in 10 years. She states that about 3 weeks ago she woke up in the morning, had some mild abdominal cramping and nausea followed by a soft formed bowel movement and shortly thereafter diarrhea. She noted some blood on the toilet tissue. The next day she had a bowel movement that had blood on the stool and blood on the toilet tissue. She has not had any further bleeding since. She has no rectal pain, itching, or burning. She has no tenesmus or mucus. She has had no anorexia or unexplained weight loss. She states that for many years she will often eat and postprandially developed lower abdominal cramping followed by a bowel movement. Her cramping is associated with bloating and gas. She was evaluated at [facility] gastroenterology 2-1/2-3 years ago for this and says she was told she had IBS. She is not having any epigastric pain, nausea, vomiting, or dysphagia at this time. She does report that several years ago when taking Fosamax she had terrible reflux, but upon discontinuing her Fosamax she has not had any problems.   Past Medical History  Diagnosis Date  . History of abnormal Pap smear   . Osteopenia   . Duplication of bilateral ureters   . Irritable bowel syndrome   . GERD (gastroesophageal reflux disease)   . Supraventricular tachycardia     Past Surgical History  Procedure Laterality Date  . Colposcopy    . Cervical biopsy  w/ loop electrode excision  10/2007, 12/2008    x2  . Abdominal  hysterectomy  05-13-09    R-TLH/BSO--recurrent dysplasia  . Cataract extraction Bilateral 09/2013   Family History  Problem Relation Age of Onset  . Colon cancer Neg Hx   . Colon polyps Neg Hx   . Diabetes Neg Hx   . Kidney disease Neg Hx   . Esophageal cancer Neg Hx   . Heart disease Neg Hx   . Gallbladder disease Neg Hx    History  Substance Use Topics  . Smoking status: Former Smoker    Types: Cigarettes    Quit date: 03/04/2014  . Smokeless tobacco: Never Used     Comment: 3 cigs/day  . Alcohol Use: 1.2 oz/week    2 Standard drinks or equivalent per week   Current Outpatient Prescriptions  Medication Sig Dispense Refill  . Ascorbic Acid (VITAMIN C) 1000 MG tablet Take 1,000 mg by mouth 3 (three) times a week.    . Calcium Carb-Cholecalciferol (CALCIUM+D3 PO) Take 1 tablet by mouth 2 (two) times daily. Pt takes 1200 mg daily    . diphenhydrAMINE (BENADRYL) 25 MG tablet Take 25 mg by mouth every 6 (six) hours as needed.    Marland Kitchen estradiol (ESTRACE VAGINAL) 0.1 MG/GM vaginal cream Use 1/2 gram vaginally at night as directed 24.2 g 1  . folic acid (FOLVITE) 353 MCG tablet Take 800 mcg by mouth daily.    Marland Kitchen MAGNESIUM PO Take 130 mg by mouth daily.    . metoprolol (LOPRESSOR)  50 MG tablet Take 50 mg by mouth as needed.    . Multiple Vitamin (MULTIVITAMIN) capsule Take 1 capsule by mouth daily.    Marland Kitchen omega-3 acid ethyl esters (LOVAZA) 1 G capsule Take 1 capsule by mouth 2 (two) times daily. Pt takes 1200 mg three times a week    . omeprazole (PRILOSEC) 20 MG capsule Take 20 mg by mouth as needed.     . hyoscyamine (LEVSIN SL) 0.125 MG SL tablet Take 1 tablet (0.125 mg total) by mouth 3 (three) times daily as needed. 60 tablet 2  . SUPREP BOWEL PREP SOLN Take 1 kit by mouth as directed. 1 Bottle 0   No current facility-administered medications for this visit.   Allergies  Allergen Reactions  . Alendronate Sodium     REACTION: severe GERD     Review of Systems: Gen: Denies any  fever, chills, sweats, anorexia, fatigue, weakness, malaise, weight loss, and sleep disorder CV: Denies chest pain, angina, palpitations, syncope, orthopnea, PND, peripheral edema, and claudication. Resp: Denies dyspnea at rest, dyspnea with exercise, cough, sputum, wheezing, coughing up blood, and pleurisy. GI: Denies vomiting blood, jaundice, and fecal incontinence.   Denies dysphagia or odynophagia. GU : Denies urinary burning, blood in urine, urinary frequency, urinary hesitancy, nocturnal urination, and urinary incontinence. MS: Denies joint pain, limitation of movement, and swelling, stiffness, low back pain, extremity pain. Denies muscle weakness, cramps, atrophy.  Derm: Denies rash, itching, dry skin, hives, moles, warts, or unhealing ulcers.  Psych: Denies depression, anxiety, memory loss, suicidal ideation, hallucinations, paranoia, and confusion. Heme: Denies bruising, bleeding, and enlarged lymph nodes. Neuro:  Denies any headaches, dizziness, paresthesias. Endo:  Denies any problems with DM, thyroid, adrenal function    Prior Endoscopies:   See history of present illness  Physical Exam: BP 142/90 mmHg  Pulse 88  Ht 5' 4.5" (1.638 m)  Wt 143 lb 8 oz (65.091 kg)  BMI 24.26 kg/m2  LMP 02/15/1990 Constitutional: Pleasant,well-developed female in no acute distress. HEENT: Normocephalic and atraumatic. Conjunctivae are normal. No scleral icterus. Neck supple.  Cardiovascular: Normal rate, regular rhythm.  Pulmonary/chest: Effort normal and breath sounds normal. No wheezing, rales or rhonchi. Abdominal: Soft, nondistended, nontender. Bowel sounds active throughout. There are no masses palpable. No hepatomegaly. Rectal: external hemorrhoids, skin tag, no stool in vault Extremities: no edema Lymphadenopathy: No cervical adenopathy noted. Neurological: Alert and oriented to person place and time. Skin: Skin is warm and dry. No rashes noted. Psychiatric: Normal mood and affect.  Behavior is normal.  ASSESSMENT AND PLAN: #1. Heme positive stools. This may be due to her hemorrhoids. She has been given a hemorrhoid handout but does not feel she needs any hemorrhoidal creams has her hemorrhoids are not bothering her at this time. She is due for colorectal cancer screening and thus will be scheduled for colonoscopy to evaluate for polyps, neoplasia, AVMs, or internal hemorrhoids as a source of her bleeding.The risks, benefits, and alternatives to colonoscopy with possible biopsy and possible polypectomy were discussed with the patient and they consent to proceed.   #2. IBS. Her symptoms of postprandial bloating and cramping and defecation are suggestive of a functional etiology such as IBS. She will be given a trial of Levsin 0.125 mg 1 by mouth 3 times a day when necessary. Patient has been asked to sign a medical release to get a copy of her records from [facility].  Follow-up recommendations will be made pending the findings of her colonoscopy.  Vernal Rutan, Deloris Ping 05/03/2014, 1:55 PM  CC: Rankins, Bill Salinas, MD

## 2014-05-03 NOTE — Patient Instructions (Signed)
You have been scheduled for a colonoscopy. Please follow written instructions given to you at your visit today.  Please pick up your prep supplies at the pharmacy within the next 1-3 days. If you use inhalers (even only as needed), please bring them with you on the day of your procedure. Your physician has requested that you go to www.startemmi.com and enter the access code given to you at your visit today. This web site gives a general overview about your procedure. However, you should still follow specific instructions given to you by our office regarding your preparation for the procedure.  We have sent the following medications to your pharmacy for you to pick up at your convenience: Levsin  About Hemorrhoids  Hemorrhoids are swollen veins in the lower rectum and anus.  Also called piles, hemorrhoids are a common problem.  Hemorrhoids may be internal (inside the rectum) or external (around the anus).  Internal Hemorrhoids  Internal hemorrhoids are often painless, but they rarely cause bleeding.  The internal veins may stretch and fall down (prolapse) through the anus to the outside of the body.  The veins may then become irritated and painful.  External Hemorrhoids  External hemorrhoids can be easily seen or felt around the anal opening.  They are under the skin around the anus.  When the swollen veins are scratched or broken by straining, rubbing or wiping they sometimes bleed.  How Hemorrhoids Occur  Veins in the rectum and around the anus tend to swell under pressure.  Hemorrhoids can result from increased pressure in the veins of your anus or rectum.  Some sources of pressure are:   Straining to have a bowel movement because of constipation  Waiting too long to have a bowel movement  Coughing and sneezing often  Sitting for extended periods of time, including on the toilet  Diarrhea  Obesity  Trauma or injury to the anus  Some liver diseases  Stress  Family history  of hemorrhoids  Pregnancy  Pregnant women should try to avoid becoming constipated, because they are more likely to have hemorrhoids during pregnancy.  In the last trimester of pregnancy, the enlarged uterus may press on blood vessels and causes hemorrhoids.  In addition, the strain of childbirth sometimes causes hemorrhoids after the birth.  Symptoms of Hemorrhoids  Some symptoms of hemorrhoids include:  Swelling and/or a tender lump around the anus  Itching, mild burning and bleeding around the anus  Painful bowel movements with or without constipation  Bright red blood covering the stool, on toilet paper or in the toilet bowel.   Symptoms usually go away within a few days.  Always talk to your doctor about any bleeding to make sure it is not from some other causes.  Diagnosing and Treating Hemorrhoids  Diagnosis is made by an examination by your healthcare provider.  Special test can be performed by your doctor.    Most cases of hemorrhoids can be treated with:  High-fiber diet: Eat more high-fiber foods, which help prevent constipation.  Ask for more detailed fiber information on types and sources of fiber from your healthcare provider.  Fluids: Drink plenty of water.  This helps soften bowel movements so they are easier to pass.  Sitz baths and cold packs: Sitting in lukewarm water two or three times a day for 15 minutes cleases the anal area and may relieve discomfort.  If the water is too hot, swelling around the anus will get worse.  Placing a cloth-covered ice pack  on the anus for ten minutes four times a day can also help reduce selling.  Gently pushing a prolapsed hemorrhoid back inside after the bath or ice pack can be helpful.  Medications: For mild discomfort, your healthcare provider may suggest over-the-counter pain medication or prescribe a cream or ointment for topical use.  The cream may contain witch hazel, zinc oxide or petroleum jelly.  Medicated suppositories are  also a treatment option.  Always consult your doctor before applying medications or creams.  Procedures and surgeries: There are also a number of procedures and surgeries to shrink or remove hemorrhoids in more serious cases.  Talk to your physician about these options.  You can often prevent hemorrhoids or keep them from becoming worse by maintaining a healthy lifestyle.  Eat a fiber-rich diet of fruits, vegetables and whole grains.  Also, drink plenty of water and exercise regularly.   2007, [employer] Doc.30

## 2014-05-04 NOTE — Progress Notes (Signed)
Reviewed and agree. Need to review [facility] records as to  Recent colonoscopy.

## 2014-05-13 ENCOUNTER — Encounter: Payer: Self-pay | Admitting: Internal Medicine

## 2014-05-13 ENCOUNTER — Ambulatory Visit (AMBULATORY_SURGERY_CENTER): Payer: Managed Care, Other (non HMO) | Admitting: Internal Medicine

## 2014-05-13 VITALS — BP 127/84 | HR 72 | Temp 97.8°F | Resp 18 | Ht 64.5 in | Wt 143.0 lb

## 2014-05-13 DIAGNOSIS — K625 Hemorrhage of anus and rectum: Secondary | ICD-10-CM | POA: Diagnosis not present

## 2014-05-13 DIAGNOSIS — K640 First degree hemorrhoids: Secondary | ICD-10-CM

## 2014-05-13 DIAGNOSIS — Z1211 Encounter for screening for malignant neoplasm of colon: Secondary | ICD-10-CM

## 2014-05-13 MED ORDER — SODIUM CHLORIDE 0.9 % IV SOLN: 500.0000 mL | INTRAVENOUS | Status: DC

## 2014-05-13 NOTE — Op Note (Signed)
[facility] [address] [place], [id number]   COLONOSCOPY PROCEDURE REPORT  PATIENT: Marissa Douglas, Marissa C  MR#: [id number] BIRTHDATE: 31-May-1950 , 63  yrs. old GENDER: female ENDOSCOPIST: Hart Carwinora M Jalyric Kaestner, MD REFERRED XB:[id number]:[name], M.D. PROCEDURE DATE:  05/13/2014 PROCEDURE:   Colonoscopy, screening First Screening Colonoscopy - Avg.  risk and is 50 yrs.  old or older - No.  Prior Negative Screening - Now for repeat screening. 10 or more years since last screening  History of Adenoma - Now for follow-up colonoscopy & has been > or = to 3 yrs.  N/A ASA CLASS:   Class II INDICATIONS:Screening for colonic neoplasia, Colorectal Neoplasm Risk Assessment for this procedure is average risk, and low-volume hematochezia on several occasions.  Prior colonoscopy in November 2006 showed first-grade internal hemorrhoids. MEDICATIONS: Monitored anesthesia care and Propofol 300 mg IV  DESCRIPTION OF PROCEDURE:   After the risks benefits and alternatives of the procedure were thoroughly explained, informed consent was obtained.  The digital rectal exam revealed no abnormalities of the rectum.   The LB [id number]-[id number]  endoscope was introduced through the anus and advanced to the cecum, which was identified by both the appendix and ileocecal valve. No adverse events experienced.   The quality of the prep was excellent. (MoviPrep was used)  The instrument was then slowly withdrawn as the colon was fully examined.      COLON FINDINGS: Small internal Grade I hemorrhoids were found. Retroflexed views revealed no abnormalities. The time to cecum = 11.54 Withdrawal time = 7.01   The scope was withdrawn and the procedure completed. COMPLICATIONS: There were no immediate complications.  ENDOSCOPIC IMPRESSION: Small internal Grade I hemorrhoids  RECOMMENDATIONS: High fiber diet Recall colonoscopy in 10 years Preparation H suppositories to use at night for rectal  bleeding  eSigned:  Hart Carwinora M Larie Mathes, MD 05/13/2014 11:41 AM   cc:

## 2014-05-13 NOTE — Progress Notes (Signed)
A/ox3 pleased with MAC, report to Wendy RN 

## 2014-05-13 NOTE — Patient Instructions (Signed)
YOU HAD AN ENDOSCOPIC PROCEDURE TODAY AT THE [facility]:   Refer to the procedure report that was given to you for any specific questions about what was found during the examination.  If the procedure report does not answer your questions, please call your gastroenterologist to clarify.  If you requested that your care partner not be given the details of your procedure findings, then the procedure report has been included in a sealed envelope for you to review at your convenience later.  YOU SHOULD EXPECT: Some feelings of bloating in the abdomen. Passage of more gas than usual.  Walking can help get rid of the air that was put into your GI tract during the procedure and reduce the bloating. If you had a lower endoscopy (such as a colonoscopy or flexible sigmoidoscopy) you may notice spotting of blood in your stool or on the toilet paper. If you underwent a bowel prep for your procedure, you may not have a normal bowel movement for a few days.  Please Note:  You might notice some irritation and congestion in your nose or some drainage.  This is from the oxygen used during your procedure.  There is no need for concern and it should clear up in a day or so.  SYMPTOMS TO REPORT IMMEDIATELY:   Following lower endoscopy (colonoscopy or flexible sigmoidoscopy):  Excessive amounts of blood in the stool  Significant tenderness or worsening of abdominal pains  Swelling of the abdomen that is new, acute  Fever of 100F or higher   For urgent or emergent issues, a gastroenterologist can be reached at any hour by calling [phone].   DIET: Your first meal following the procedure should be a small meal and then it is ok to progress to your normal diet. Heavy or fried foods are harder to digest and may make you feel nauseous or bloated.  Likewise, meals heavy in dairy and vegetables can increase bloating.  Drink plenty of fluids but you should avoid alcoholic beverages for 24  hours.  ACTIVITY:  You should plan to take it easy for the rest of today and you should NOT DRIVE or use heavy machinery until tomorrow (because of the sedation medicines used during the test).    FOLLOW UP: Our staff will call the number listed on your records the next business day following your procedure to check on you and address any questions or concerns that you may have regarding the information given to you following your procedure. If we do not reach you, we will leave a message.  However, if you are feeling well and you are not experiencing any problems, there is no need to return our call.  We will assume that you have returned to your regular daily activities without incident.  If any biopsies were taken you will be contacted by phone or by letter within the next 1-3 weeks.  Please call us at [phone] if you have not heard about the biopsies in 3 weeks.    SIGNATURES/CONFIDENTIALITY: You and/or your care partner have signed paperwork which will be entered into your electronic medical record.  These signatures attest to the fact that that the information above on your After Visit Summary has been reviewed and is understood.  Full responsibility of the confidentiality of this discharge information lies with you and/or your care-partner.  Recommendations Next routine colonoscopy in 10 years. High fiber diet and hemorrhoid handouts provided. Discharge instructions given to patient and/or care partner.

## 2014-05-14 ENCOUNTER — Telehealth: Payer: Self-pay

## 2014-05-14 NOTE — Telephone Encounter (Signed)
  Follow up Call-  Call back number 05/13/2014  Post procedure Call Back phone  # [phone]  Permission to leave phone message Yes     Patient questions:  Do you have a fever, pain , or abdominal swelling? No. Pain Score  0 *  Have you tolerated food without any problems? Yes.    Have you been able to return to your normal activities? Yes.    Do you have any questions about your discharge instructions: Diet   No. Medications  No. Follow up visit  No.  Do you have questions or concerns about your Care? No.  Actions: * If pain score is 4 or above: No action needed, pain <4.

## 2014-05-24 ENCOUNTER — Encounter: Payer: Self-pay | Admitting: Internal Medicine

## 2014-06-12 ENCOUNTER — Telehealth: Payer: Self-pay | Admitting: Physician Assistant

## 2014-06-13 MED ORDER — HYOSCYAMINE SULFATE 0.125 MG SL SUBL: 0.1250 mg | SUBLINGUAL_TABLET | Freq: Three times a day (TID) | SUBLINGUAL | Status: AC | PRN

## 2014-06-13 NOTE — Telephone Encounter (Signed)
[facility]

## 2014-07-02 ENCOUNTER — Ambulatory Visit: Payer: Self-pay | Admitting: Internal Medicine

## 2014-07-31 ENCOUNTER — Telehealth: Payer: Self-pay | Admitting: Obstetrics and Gynecology

## 2014-07-31 NOTE — Telephone Encounter (Signed)
Patient canceled her 6 month pap 09/18/14. Patient did not wish to reschedule, [place] is [place]. Patient says she will send for records when she gets established with new GYN.

## 2014-07-31 NOTE — Telephone Encounter (Signed)
FYI--Dr. Edward Jolly just wanted to make you aware of this.  Routed to Dr. Edward Jolly

## 2014-08-01 NOTE — Telephone Encounter (Signed)
Out of Pap recall.  Routing to [place].  OK to close encounter.  Will leave for Dr. Edward Jolly to review as well.

## 2014-08-01 NOTE — Telephone Encounter (Signed)
Pt removed from current pap recalls.  Closing encounter.

## 2014-09-18 ENCOUNTER — Ambulatory Visit: Payer: Managed Care, Other (non HMO) | Admitting: Obstetrics and Gynecology

## 2015-02-26 ENCOUNTER — Ambulatory Visit: Payer: Managed Care, Other (non HMO) | Admitting: Obstetrics and Gynecology
